# Patient Record
Sex: Female | Born: 2000 | Race: Black or African American | Hispanic: No | Marital: Single
Health system: Southern US, Community
[De-identification: ages and names within clinical notes are randomized; demographics above are authoritative.]

## PROBLEM LIST (undated history)

## (undated) HISTORY — PX: HIP SURGERY: SHX245

---
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
## 2023-02-05 ENCOUNTER — Inpatient Hospital Stay
Admit: 2023-02-05 | Discharge: 2023-02-05 | Disposition: A | Discharge status: Home / Self Care | Source: Home / Self Care

## 2023-02-05 DIAGNOSIS — N939 Abnormal uterine and vaginal bleeding, unspecified: Secondary | ICD-10-CM

## 2023-02-05 DIAGNOSIS — R110 Nausea: Secondary | ICD-10-CM

## 2023-02-05 LAB — CBC: MCH CONCENTRATION: 32.5 g/dL (ref 31.5–35.5)

## 2023-02-05 LAB — eGFR by Creatinine

## 2023-02-05 LAB — Glucose: GLUCOSE: 91 mg/dL (ref 65–99)

## 2023-02-05 LAB — Electrolyte Panel
POTASSIUM: 4.3 mmol/L (ref 3.6–5.3)
SODIUM: 137 mmol/L (ref 135–146)

## 2023-02-05 LAB — hCG,Total beta (ED): HCG,TOTAL BETA (ED): <1 m[IU]/mL

## 2023-02-05 LAB — Lipase: LIPASE: 21 U/L (ref 13–69)

## 2023-02-05 LAB — Hepatic Funct Panel: ALANINE AMINOTRANSFERASE: 18 U/L (ref 8–70)

## 2023-02-05 NOTE — ED Notes
Patient stable for d/c per MD. Aftercare instructions provided for patient. Instructed to f/u with pcp, strict return precaution, verbalized understanding. Pt alert and oriented. All belongings taken with patient.Ambulatory with strong steady gait.

## 2023-02-05 NOTE — Discharge Instructions
You have been evaluated in the [facility] Emergency Department today for your vaginal bleeding.  I am not sure what was causing her symptoms however you are not pregnant.  You were also having some epigastric pain but your labs are normal and did not see any stones or swelling of the gallbladder on your ultrasound.     Please follow up with your primary care physician within two days. Please follow-up with your primary care doctor. You can find a primary care doctor at [facility] by calling [phone].     Please follow up with your OB/Gyn within 2 days. Call [phone] to schedule an appointment with a [facility].     Return to the Emergency Department if you experience worsening or uncontrolled bleeding, shortness of breath, feeling lightheaded, chest tightness, abdominal cramping, severe abdominal pain, fevers,vomiting, or for any other concerning symptoms.     Thank you for choosing [facility] for your care.

## 2023-02-08 NOTE — ED Provider Notes
Debbie Knapp [facility]  Emergency Department Service Report    Triage     Debbie Knapp, a 22 y.o. female, presents with Vaginal Bleeding (Pt c/o vaginal bleeding ) and Possible Pregnancy (Pt c/o vaginal bleeding & possible pregnancy. Bleeding started around 1300 this after, Hemocue 12.7 in triage. Pt period 28 days late, took 2 pregnancy tests, one positive and one negative. Pt endorses urinary frequency, nausea, food cravings.  )    Arrived on 02/04/2023 at 10:18 PM   Arrived by Walk-in [14]    ED Triage Vitals   Temp Temp Source BP Heart Rate Resp SpO2 O2 Device Pain Score Weight   02/04/23 2302 02/04/23 2302 02/04/23 2302 02/04/23 2302 02/04/23 2302 02/04/23 2302 02/05/23 0340 -- --   36.5 ?C (97.7 ?F) Temporal 125/83 (!) 105 18 100 % None (Room air)         Not on File     Initial Physician Contact       Comprehensive Exam Initiated  Contact Date: 02/05/23  Contact Time: 0213    History   HPI  22 year old female presents with concerns over a missed period, breast tenderness, epigastric pain and vomiting with eating.Patient states that approximately 4 weeks ago was her last.  And around that time she also had a sexual encounter.  Patient denies any dysuria, vaginal pain, vaginal discharge.  Patient is concerned that she is pregnant.  Patient did start having scant vaginal bleeding in the last 2 days.  This is minor without gushing blood or clots.  Patient was denies any presyncopal symptoms such as dizziness, malaise, fatigue.  Patient was does not have concerns for STI.  Patient states she took a home pregnancy test and 1 was fainting in the positive.  Patient denies any diarrhea, fevers, dysuria, flank pain, bloody stools or bloody vomit.  Patient has never been diagnosed with gastritis, GERD, gallstones.      Financial risk analyst Used?: No               History reviewed. No pertinent past medical history.     History reviewed. No pertinent surgical history.     Past Family History family history is not on file.     Past Social History   she has no history on file for tobacco use, alcohol use, drug use, and sexual activity.       Physical Exam   Physical Exam  GENERAL APPEARANCE:  AxOx4, generally well-appearing, no acute distress.  HEENT:  NC, AT. MMM. EOMI, clear conjunctiva, oropharynx clear.  NECK:  Supple without lymphadenopathy.  No stiffness or restricted ROM.  HEART:  Normal rate and regular rhythm, normal S1/S1, no m/r/g  LUNGS:  CTAB, moving air well. No crackles or wheezes are heard.  ABDOMEN:  Soft, nontender, nondistended with good bowel sounds heard.  BACK: No CVAT, no obvious deformity.  EXTREMITIES:  Without cyanosis, clubbing or edema.  NEUROLOGICAL:  Grossly nonfocal. Alert and oriented, moving all 4 extremities. CN not formally tested but appear grossly intact. Observed to ambulate with normal gait.  Skin:  Warm and dry without any rash.      Medical Decision Making   Debbie Knapp is a 22 y.o. female     Patient presents requesting a pregnancy test, also reporting epigastric abdominal pain and vomiting after eating.  Patient denies red flag symptoms to include vaginal discharge, dysuria, vaginal or pelvic pain.  On exam patient has mild epigastric tenderness without any lower  abdominal/pelvic tenderness.  Patient was well-appearing.  Serum HCG was negative.  Etiologies of the patient's epigastric pain include gastritis, GERD, gallstone, peptic ulcer disease.  Bedside ultrasound shows no gallstones, gallbladder dilation, gallbladder wall thickening.  Labs are unremarkable to include CBC, bilirubin, lipase, alk phos.  I offered the patient a pelvic exam and STI testing and she deferred as she has not concerned about STI.  Also offered ultrasound with the patient would also like to defer as she was reassured that she has not pregnant (the questionable sexual encounter occurred approximately 4 weeks ago).  The patient was advised to start over-the-counter as a antacid such as Pepcid and she verbalized understanding of this.  Return precautions were discussed patient was discharged in good condition.    Medical Decision Making  Amount and/or Complexity of Data Reviewed  Labs: ordered. Decision-making details documented in ED Course.  Radiology: ordered.             Progress Notes / Reassessments     ED Course as of 02/08/23 1633   Tue Feb 05, 2023   0218 hCG,Total beta: <1 [RK]   0224 White Blood Cell Count(!): 11.13 [RK]   0259 Albumin: 3.9 [RK]   0259 Bilirubin,Total: 0.2 [RK]   0259 Bilirubin,Conj: <0.2 [RK]   0259 Alkaline Phosphatase: 90 [RK]   0259 AST (SGOT): 29 [RK]   0259 ALT (SGPT): 18 [RK]   0259 Lipase: 21 [RK]      ED Course User Index  [RK] Debbie Argyle, Richard A. III, MD              ED Course      Laboratory Results     Labs Reviewed   CBC - Abnormal; Notable for the following components:       Result Value    White Blood Cell Count 11.13 (*)     All other components within normal limits   ELECTROLYTE PANEL - Normal   GLUCOSE - Normal   HEPATIC FUNCT PANEL - Normal   LIPASE - Normal   HCG,TOTAL BETA,STAT - ED   EGFR BY CREATININE   RH TYPE       Imaging Results     No orders to display       Consults     Consult Orders Placed This Encounter       None            Clnical Impression        1. Vaginal bleeding    2. Nausea without vomiting           Disposition and Follow-up   Disposition: Discharge [1]       Follow up with: PMD and outpatient Gyn    Return precautions are specified on After Visit Summary.    There are no discharge medications for this patient.      Medications Administered This Encounter       None              Resident Signature          Knecht, Richard A. III, MD  Resident  02/08/23 1633    ATTENDING NOTE    I was present with the resident during the key/critical portions of this service. I have discussed the management with the resident, have reviewed the resident note and agree with the documented findings and plan of care.                 Debbie Knapp, [place]  P., MD  02/08/23 1954

## 2023-06-11 ENCOUNTER — Inpatient Hospital Stay
Admit: 2023-06-11 | Discharge: 2023-06-11 | Disposition: A | Discharge status: Home / Self Care | Payer: PRIVATE HEALTH INSURANCE | Source: Home / Self Care

## 2023-06-11 DIAGNOSIS — R509 Fever, unspecified: Secondary | ICD-10-CM

## 2023-06-11 DIAGNOSIS — J029 Acute pharyngitis, unspecified: Secondary | ICD-10-CM

## 2023-06-11 LAB — HIV-1/2 Ag/Ab 4th Generation with Reflex Confirmation: HIV-1/2 AG/AB 4TH GENERATION WITH REFLEX CONFIRMATION: NONREACTIVE

## 2023-06-11 LAB — Expedited COVID-19 and Influenza A B PCR: INFLUENZA A PCR: NOT DETECTED

## 2023-06-11 LAB — Group A Streptococcus PCR: GROUP A STREPTOCOCCUS PCR: NOT DETECTED

## 2023-06-11 LAB — Infectious Mono Ab: INFECTIOUS MONONUCLEOSIS AB: NONREACTIVE

## 2023-06-11 MED ADMIN — ACETAMINOPHEN 500 MG PO TABS: 1000 mg | ORAL | @ 16:00:00 | Stop: 2023-06-11 | NDC 00904673061

## 2023-06-11 MED ADMIN — ONDANSETRON HCL 4 MG/2ML IJ SOLN: 4 mg | INTRAVENOUS | @ 17:00:00 | Stop: 2023-06-11 | NDC 60505613000

## 2023-06-11 MED ADMIN — LACTATED RINGERS IV BOLUS: 1000 mL | INTRAVENOUS | @ 17:00:00 | Stop: 2023-06-11 | NDC 00338011704

## 2023-06-11 MED ADMIN — KETOROLAC TROMETHAMINE 30 MG/ML IJ SOLN: 15 mg | INTRAVENOUS | @ 17:00:00 | Stop: 2023-06-11 | NDC 25021070101

## 2023-06-11 MED ADMIN — DEXAMETHASONE SODIUM PHOSPHATE 4 MG/ML IJ SOLN (IV FOR PO): 10 mg | ORAL | @ 19:00:00 | Stop: 2023-06-11 | NDC 67457042300

## 2023-06-11 NOTE — Discharge Instructions
Emergency Department Discharge Instructions      Summary of your visit  You have been evaluated in the [facility] Emergency Department today for your sore throat.  Your evaluation included a physical exam and labs.  We have observed you in the ER and have determined that you are stable for discharge at this time.  Your swabs for mono, strep, COVID, and flu have been negative. Your symptoms are likely due to a viral illness. Continue to tylenol and ibuprofen for your symptoms. You may take tylenol 650 mg every 6 hours and ibuprofen 400 mg every 6 hours, and alternate these every 3 hours as needed for fever or pain.     Follow-Up  Please follow up with your primary care physician within three days after being discharged from the ER - you can call to schedule an appointment.  You can find a primary care physician at [facility] by calling [phone].    If you do not have a Primary Care Physician, please call your insurance company or you may call [phone] to establish care with a [facility] physician.  If you are uninsured, please call 2-1-1 to find a free or low-cost clinic in your area. 2-1-1 [place] is the central source for providing information and referrals for all health and human services in [facility] [place]. Our 2-1-1 phone line is open 24 hours, 7 days a week, with trained [employer] prepared to offer help with any situation, any time. Our community services go far beyond phone referrals - explore our website to learn more. If you are calling from outside [facility] or cannot directly dial 2-1-1, you can call [phone].      Return to the Emergency Department if you experience:  Fevers 100.4 ?F or greater  Worsening or uncontrolled pain  Tongue swelling  Trouble swallowing  Trouble breathing  A change in your voice  Persistent nausea and vomiting  Chest pain or shortness of breath  Any other concerning symptoms     Thank you for choosing [facility] for your care. It was a pleasure taking part in your care today, and we wish you the best!
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
## 2023-06-12 DIAGNOSIS — J029 Acute pharyngitis, unspecified: Secondary | ICD-10-CM

## 2023-06-12 DIAGNOSIS — R509 Fever, unspecified: Secondary | ICD-10-CM

## 2023-06-12 LAB — RPR: RPR: NONREACTIVE

## 2023-06-13 ENCOUNTER — Inpatient Hospital Stay
Admit: 2023-06-13 | Discharge: 2023-06-13 | Disposition: A | Discharge status: Home / Self Care | Payer: PRIVATE HEALTH INSURANCE | Source: Home / Self Care | Attending: Student in an Organized Health Care Education/Training Program

## 2023-06-13 LAB — Extra Dark Green Top

## 2023-06-13 LAB — Extra Gold Top

## 2023-06-13 LAB — Group A Streptococcus PCR: GROUP A STREPTOCOCCUS PCR: NOT DETECTED

## 2023-06-13 LAB — Extra Light Green Top

## 2023-06-13 LAB — Extra Lavender Top

## 2023-06-13 MED ADMIN — LIDOCAINE VISCOUS HCL 2 % MT SOLN: 20 mL | ORAL | @ 07:00:00 | Stop: 2023-06-13 | NDC 00121495015

## 2023-06-13 MED ADMIN — ACETAMINOPHEN 10 MG/ML IV SOLN: 1000 mg | INTRAVENOUS | @ 05:00:00 | Stop: 2023-06-13 | NDC 00264410090

## 2023-06-13 MED ADMIN — KETOROLAC TROMETHAMINE 30 MG/ML IJ SOLN: 15 mg | INTRAVENOUS | @ 05:00:00 | Stop: 2023-06-13 | NDC 25021070101

## 2023-06-13 MED ADMIN — LACTATED RINGERS IV BOLUS: 500 mL | INTRAVENOUS | @ 07:00:00 | Stop: 2023-06-13 | NDC 00338011704

## 2023-06-13 MED ADMIN — SODIUM CHLORIDE 0.9 % IV BOLUS: 1000 mL | INTRAVENOUS | @ 05:00:00 | Stop: 2023-06-13 | NDC 00338004904

## 2023-06-13 NOTE — Discharge Instructions
Emergency Department Discharge Instructions      Summary of your visit  You have been evaluated in the [facility] Department today for your sore throat.  Your evaluation included a physical exam.  We have observed you in the ER and have determined that you are stable for discharge at this time.      Follow-Up  Please follow up with your primary care physician within three days after being discharged from the ER - you can call to schedule an appointment.  You can find a primary care physician at [facility] by calling [phone].    If you do not have a Primary Care Physician, please call your insurance company or you may call [phone] to establish care with a [facility] physician.  If you are uninsured, please call 2-1-1 to find a free or low-cost clinic in your area. 2-1-1 [place] is the central source for providing information and referrals for all health and human services in [place]. Our 2-1-1 phone line is open 24 hours, 7 days a week, with trained Community Resource Advisors prepared to offer help with any situation, any time. Our community services go far beyond phone referrals - explore our website to learn more. If you are calling from outside [place] or cannot directly dial 2-1-1, you can call [phone].      Return to the Emergency Department if you experience:  Fevers 100.4 F or greater  Worsening or uncontrolled pain  Tongue swelling  Trouble swallowing  Trouble breathing  A change in your voice  Persistent nausea and vomiting  Chest pain or shortness of breath  Any other concerning symptoms     Thank you for choosing [facility] for your care. It was a pleasure taking part in your care today, and we wish you the best!

## 2023-06-13 NOTE — ED Notes
[facility]?NOTIFICATION?06/12/2023 21:15?VANITY, DAIGLER S?MRN: [id number]    Criteria Met      2 Visits in 30 Days    Security and Safety  No Security Events were found.  ED Care Guidelines  There are currently no ED Care Guidelines for this patient. Please check your facility's medical records system.        Prescription Drug Report (12 Mo.)  PDMP query found no report.    E.D. Visit Count (12 mo.)  Facility Visits   [name]   Total 3   Note: Visits indicate total known visits.     Recent Emergency Department Visit Summary  Date Facility [facility] Type Diagnoses or Chief Complaint    Jun 12, 2023  [facility]  Emergency     Jun 11, 2023  [facility] Dur[facility]  [place]  Emer[place]      1. Fever, unspecified      1. Headache      2. Acute pharyngitis, unspecified      2. ILI/PUI      Feb 05, 2023  Ser[facility] Sl[facility]  [place]  Emergency      1. Abnormal uterine and vaginal bleeding, unspecified      1. Vaginal Bleeding      2. Nausea      2. Possible Pregnancy        Recent Inpatient Visit Summary  No Recent Inpatient Visits were found.  Care Team  [facility] Phone Fax Service Dates   Eliezer Mccoy, M.D. Pediatrics   Current    Adele Schilder, PA Physician Assistant [phone]  Current    PECK, Marylou Flesher, FNP Nurse Practitioner: Family [phone] Current      [facility]  This patient has registered at the [facility] Emergency Department  For more information visit: [email or web address]   PLEASE NOTE:     1.   Any care recommendations and other clinical information are provided as guidelines or for historical purposes only, and providers should exercise their own clinical judgment when providing care.    2.   You may only use this information for purposes of treatment, payment or health care operations activities, and subject to the limitations of applicable [facility] Policies.    3.   You should consult directly with the organization that provided a care guideline or other clinical history with any questions about additional information or accuracy or completeness of information provided.    ? 2024 [facility] - [email or web address]

## 2023-06-13 NOTE — ED Notes
Pt refused IV access from this time. Pt anxious and requests if we can start access once she is ''roomed in a comfortable area.''

## 2023-06-13 NOTE — ED Provider Notes
Debbie Knapp [facility]  Emergency Department Service Report    Triage     Debbie Knapp, a 22 y.o. female, presents with Sore Throat (Per patient was seen here yesterday for sore throat, fever, and headache. Strep was done but negative. Today she states that the pain has worsened. Unable to eat. Patient states she had a fever all day but has not taken any medication due to the pain ) and Fever    Arrived on 06/12/2023 at 9:15 PM   Arrived by Walk-in [14]    ED Triage Vitals   Temp Temp Source BP Heart Rate Resp SpO2 O2 Device Pain Score Weight   06/12/23 2129 06/12/23 2128 06/12/23 2128 06/12/23 2128 06/12/23 2129 06/12/23 2128 -- 06/12/23 2129 06/12/23 2129   (!) 39.6 ?C (103.2 ?F) Oral 114/73 (!) 141 18 99 %  Ten 95.3 kg (210 lb)       No Known Allergies     Initial Physician Contact       Comprehensive Exam Initiated  Contact Date: 06/12/23  Contact Time: 2228    History   HPI    Patient is a previously healthy 22 year old woman who presents with fever and sore throat.  Patient was here in the emergency department yesterday for the similar complaint.  Workup there was unremarkable with negative strep throat negative mono negative flu negative COVID.  Patient was given steroids and analgesia and fluids.  After which she found improvement and was discharged yesterday.  He says she was feeling okay when she went home but woke up this morning with continued persistently sore throat and difficulty swallowing.  She has had poor p.o. intake but has been able to drink water. No drooling, voice changes, or trismus. Tolerating secretions. Can take PO. No facial swelling.  Patient has not been recently sexually active since she has moved to [place] for the past 5 months.  Does not want to be tested for STDs.    Patient has been having some productive cough as well as URI symptoms.  No sick contacts.  Language Assistance                     History reviewed. No pertinent past medical history.     Past Surgical History:   Procedure Laterality Date    HIP SURGERY          Past Family History   family history is not on file.     Past Social History   she reports that she has never smoked. She has never used smokeless tobacco. She reports that she does not currently use alcohol. She reports current drug use. Drug: Marijuana. No history on file for sexual activity.       Physical Exam   Physical Exam  Gen: Well-appearing adult  HEENT: No scleral icterus, conjunctiva normal. Tonsillar stones, Uvula midline, Soft palate without bulge. FOM soft. No trismus. No TTP on teeth. No obvious caries.  Neck: Trachea midline.  Full neck range of motion.  No stiffness.  CV: RRR. Symmetric radial pulses. Good cap refill. No LE Edema.  Resp: Unlabored breathing.   Abdomen: Soft.  Extremities: Warm and well-perfused.  Skin: No Visible skin lesions or rashes.   Neuro: Alert and oriented. Attentive to conversation. Fluent speech. Eyes track spontaneously. Face symmetric. No dysarthria. Hearing intact to conversation. Moves all fours AG.  Psych: Cooperative behavior. Linear thought process.      Medical Decision Making   Patient  is a previously healthy 22 year old woman who presents with fever and sore throat.  Patient febrile and tachycardic here, likely in the setting of poor p.o. intake.  We will give analgesia and IV fluids.  We will defer steroids here since patient was given dexamethasone yesterday.  Patient otherwise well appearing.  Exam and presentation most consistent with a viral syndrome.  Low concern for bacterial pharyngitis at this time.  No evidence of PTA and lower concern for RPA or other deep neck space infection at this time.  Patient otherwise well-appearing.  We will provide supportive care with plans to DC    Medical Decision Making     Problems  Clinical Impressions Complexity of problems addressed         Sore throat (Primary)  Fever, unspecified fever cause High:  []  Acute/chronic illness/injury with threat to life to bodily function  []  Chronic illness with severe exacerbation, progression, or side effects of treatment    Moderate:  [x]  Undiagnosed new problem with uncertain prognosis  []  Acute illness with systemic symptoms  []  Acute complicated injury    Data and Risk  Independent Historian []  Parent as child too young to provide hx []  Family/Caregiver due to AMS/dementia []  EMS due to medical acuity/trauma []  Family/EMS due to behavioral health concern and for collateral []    External Data Reviewed previous workup and mgmt of patient's Sore Throat and Fever via  []  Previous [place] Notes/Labs/Imaging []  External Notes/Labs/Imaging  which were non-contributory unless documented otherwise in HPI and ED Course   Considered but decided against  []  CT Head/C-spine given [place] CT/NEXUS/PECARN criteria []  CTA Chest to r/o PE given PERC/Well's criteria []  CT AP to r/o appendicitis given PAS score or family discussion []  Hospitalization due to *  []    Discussed w/ ext HCP []  Consults, PCP/outpt specialists, nursing home. See ED Course for details.   SDOH Affecting Dx/Tx []  Insurance limiting specialist referral []  Housing instability limiting outpt mgmt   []  Financial insecurity limiting medication access []  Substance/ETOH use []    Care de-escalation []  Shared decision-making regarding de-escalation of care (e.x. DNR) or foregoing hospitalization-level of care due to patient wishes/goals of care   Interpretations See ED Course. []     If applicable, parenteral controlled substances, drug therapies requiring intensive monitoring for toxicity, and prescription drug management are documented in the the Medications section of this note. If applicable, major and minor procedures are documented separately in Procedure Notes.         Progress Notes / Reassessments                 ED Course      Laboratory Results     Labs Reviewed   GROUP A STREPTOCOCCUS PCR - Normal   BACTERIAL CULTURE THROAT   EXTRA DARK GREEN TOP   EXTRA LIGHT GREEN TOP EXTRA LAVENDER TOP   RAINBOW DRAW TO LABORATORY    Narrative:     The following orders were created for panel order Brigid Re (ED Adult Blood Draw: Nurse Protocol).  Procedure                               Abnormality         Status                     ---------                               -----------         ------  Extra Anthoney Harada FAO[[id number]]                             Final result               Extra Burna Mortimer [phone]                                                       Extra Burna Mortimer [phone]                            Final result               Extra Lavender [phone]                               Final result               Extra Lavender Top[[id number]]                                                          Extra Gold Top[[id number]]                                                                Please view results for these tests on the individual orders.   EXTRA LIGHT GREEN TOP   EXTRA LAVENDER TOP   EXTRA GOLD TOP       Imaging Results     No orders to display       Consults     Consult Orders Placed This Encounter       None            Clinical Impressions           Sore throat (Primary)  Fever, unspecified fever cause       Disposition and Follow-up     Disposition: Discharge [1]    No future appointments.    Follow up with:  No follow-up provider specified.    Return precautions are specified on After Visit Summary.    There are no discharge medications for this patient.      Medications Administered This Encounter           Status .     lactated ringers IV soln bolus 500 mL  Once         Last MAR action: Stopped      lidocaine viscous 2% oral soln 20 mL  STAT         Last MAR action: Given      acetaminophen IV inj 1,000 mg  Once         Last MAR action: Given      sodium chloride 0.9% IV soln bolus 1,000 mL  Once         Last [facility]  action: Stopped      ketorolac 30 mg/mL inj 15 mg  STAT         Last MAR action: Given Resident Signature          Jolly Mango, MD  Resident  06/12/23 2348    I was present with the resident during the key/critical portions of this service.  I have discussed the management with the resident, have reviewed the resident note and agree with the documented findings and plan of care.        Ernie Avena., MD, MBA  06/13/23 [phone]

## 2023-06-13 NOTE — ED Notes
Pt d/c'd per order. Discharge education and medication education provided w/ teach back returned. VSS. No signs of acute distress. Pt out of ED w/ steady gait.

## 2023-06-14 LAB — Bacterial Culture Throat: BACTERIAL CULTURE THROAT: NORMAL

## 2023-06-15 LAB — Bacterial Culture Throat: BACTERIAL CULTURE THROAT: NORMAL

## 2023-09-04 ENCOUNTER — Ambulatory Visit: Payer: PRIVATE HEALTH INSURANCE

## 2023-09-06 ENCOUNTER — Ambulatory Visit: Payer: PRIVATE HEALTH INSURANCE

## 2023-09-06 DIAGNOSIS — F419 Anxiety disorder, unspecified: Secondary | ICD-10-CM

## 2023-09-06 DIAGNOSIS — Z23 Encounter for immunization: Secondary | ICD-10-CM

## 2023-09-06 DIAGNOSIS — F649 Gender identity disorder, unspecified: Secondary | ICD-10-CM
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
## 2023-09-06 NOTE — Progress Notes
History was provided by the patient.    Debbie Knapp is a 22 y.o. NB who is here for an annual physical.      CURRENT ISSUES:  Current concerns include    #gender dysphoria  -- identifies as ''Debbie Knapp''  -- pronouns in chart are she/her/hers; Debbie Knapp does not want to change it at this time, but we discussed that they may change their pronouns at any time to best represent who they are  -- Debbie Knapp states that they have never felt that their gender identity fit with a gender binary. They moved recently from [place] and have been able to begin to further explore their gender identity and that they are now ready to start their journey to having their gender expression match their gender identity  -- Debbie Knapp would like to start testosterone, have top surgery, and is exploring bottom surgery  -- Debbie Knapp states that their menstrual cycles are very distressing and that they have been on birth control (nexplanon) previously to help stop it, but are not on any now    #mood  -- endorses significant anxiety  -- has been on several psychiatric medications in the past, but is unsure which ones  -- saw psychology today; please see psychology note from today for further details    #weight  -- have been trying to eat healthier / exercise  -- interested in possibly starting a GLP-1 agonist      MEDICATIONS/ALLERGIES:  No outpatient medications prior to visit.     No facility-administered medications prior to visit.     Allergies   Allergen Reactions    Pear Unknown (Patient not available)    Pineapple Other (See Comments)     Blisters on tongue     PAST MEDICAL HISTORY:  No past medical history on file.      FAMILY HISTORY:  No family history on file.    -Alcohol/drug use: denies    -What was your sex assigned at birth? female  -What is your sexual identity? queer  -Have you ever had sex (oral, vaginal, or anal)? yes      Gyn History:  Menstrual periods: reg, every 4 weeks, duration 5 days, +dysmenorrhea  History of STD? no  Last STI testing: has been tested since last partner  Last pap testing: n/a  Past history of genital infections: none    Contraception history:  Nexplanon in pas    ROS:  Pertinent positives noted in HPI  The remainder of systems were reviewed and are negative.     PHYSICAL EXAMINATION:    Chaperone:  No sensitive exam    Objective:        Vitals:    09/06/23 1441   BP: 102/71   Pulse: (!) 102   Temp: 36.3 ?C   TempSrc: Tympanic   Weight: (!) 101.3 kg (223 lb 6.4 oz)   Height: 1.587 m (5' 2.48'')     Body mass index is 40.23 kg/m?Marland Kitchen  Blood pressure %iles are not available for patients who are 18 years or older.  General appearance: alert, appears stated age, and cooperative  Head: Normocephalic, without obvious abnormality, atraumatic  Ears: normal TM's and external ear canals both ears  Nose: Nares normal. Septum midline. Mucosa normal. No drainage or sinus tenderness.  Throat: lips, mucosa, and tongue normal; teeth and gums normal  Neck: no adenopathy, no carotid bruit, no JVD, supple, symmetrical, trachea midline, and thyroid not enlarged, symmetric, no tenderness/mass/nodules  Lungs: clear to auscultation  bilaterally  Heart: regular rate and rhythm, S1, S2 normal, no murmur, click, rub or gallop  Abdomen: soft, non-tender; bowel sounds normal; no masses,  no organomegaly  Extremities: extremities normal, atraumatic, no cyanosis or edema  Neurologic: Grossly normal  normal mood, behavior, speech, dress, and thought processes             ASSESSMENT/PLAN  22 y.o. old female here for annual physical with following problems and associated plans:    There are no diagnoses linked to this encounter.  Immunizations today: per orders. I discussed the risks and benefits of vaccines given today with parent/guardian. VIS sheets were given to the parent/guardian Debbie Knapp is able to receive appropriate vaccination today. she: does not have any significant illness, is not allergic to any of the vaccine components, has not had a serious vaccine reaction in the past, has not received blood products or immune globulin in the last 4 months, is not taking oral steroids or other immunosuppressive medications, has not received any vaccinations in the last 28 days, and is not pregnant.     1. Anticipatory guidance discussed.  -- vaccines updated    2. Gender Dysphoria  -- will place baseline laboratory studies  -- will speak with gender affirming care attending, Dr. Lupita Dawn, and create a plan of care  -- will start aygestin for menstrual suppression  -- patient to follow up in 2 weeks    3. GAD  -- will start lexapro 10mg  today  -- mental health resources given to Debbie Knapp by psychology  -- will follow up in 2 weeks with psychology    4. Elevated BMI  -- will discuss GLP-1 agonist at next visit  -- discussed dietary / exercise habits    Follow-up visit in 2 to 3 weeks    Author: Carloyn Manner 09/12/2023 2:25 PM   Adolescent Medicine PGY5    Discussed with attending Dr. Dareen Piano.  I saw and evaluated Debbie Knapp.  I discussed the case with the fellow and agree with the findings and plan of care as documented in the fellow's note along with my additions and/or corrections.     Sheron Nightingale. Dareen Piano

## 2023-09-07 MED ORDER — ESCITALOPRAM OXALATE 10 MG PO TABS
10 mg | ORAL_TABLET | Freq: Every day | ORAL | 3 refills | Status: AC
Start: 2023-09-07 — End: ?

## 2023-09-07 MED ORDER — MEDROXYPROGESTERONE ACETATE 5 MG PO TABS
5 mg | ORAL_TABLET | Freq: Every day | ORAL | 3 refills | 30.00000 days | Status: AC
Start: 2023-09-07 — End: ?

## 2023-09-07 MED ORDER — ESCITALOPRAM OXALATE 10 MG PO TABS
10 mg | ORAL_TABLET | Freq: Every day | ORAL | 0 refills | 30.00000 days | Status: AC
Start: 2023-09-07 — End: 2023-09-07

## 2023-09-07 MED FILL — ESCITALOPRAM OXALATE 10 MG PO TABS: 10 mg | ORAL | 30 days supply | Qty: 30 | Fill #0

## 2023-09-12 ENCOUNTER — Ambulatory Visit: Payer: PRIVATE HEALTH INSURANCE

## 2023-09-12 DIAGNOSIS — Z0000 Encounter for general adult medical examination without abnormal findings: Secondary | ICD-10-CM

## 2023-09-12 DIAGNOSIS — Z6841 Body Mass Index (BMI) 40.0 and over, adult: Secondary | ICD-10-CM

## 2023-09-27 ENCOUNTER — Ambulatory Visit: Payer: PRIVATE HEALTH INSURANCE

## 2023-09-27 DIAGNOSIS — F649 Gender identity disorder, unspecified: Secondary | ICD-10-CM

## 2023-09-27 DIAGNOSIS — F419 Anxiety disorder, unspecified: Secondary | ICD-10-CM

## 2023-09-27 MED ORDER — SEMAGLUTIDE-WEIGHT MANAGEMENT 0.25 MG/0.5ML SC SOAJ
.25 mg | SUBCUTANEOUS | 0 refills | 28.00 days | Status: AC
Start: 2023-09-27 — End: ?
  Filled 2023-11-20 (×2): qty 2, 28d supply, fill #0

## 2023-09-27 NOTE — Patient Instructions
Semaglutide instructions  Semaglutide makes you feel more full and slows down your gastrointestinal motility. The most common side effects are nausea, abdominal discomfort, constipation, and diarrhea. These are worst when starting the medication or increasing the dose, and get better as you get used to the medication. Eating smaller portions and staying well hydrated can help to prevent side effects. Avoid excessive alcohol intake to reduce the risk of dehydration and pancreatitis. Do NOT take the medication if you have a history of MEN2 syndrome or medullary thyroid cancer in your family.    This medication alone does not typically cause low blood sugar, but can when used in combination with insulin or other diabetes medications. If you experience symptoms of a low blood sugar such as lightheadedness, shakiness, dizziness, extreme hunger, palpitations, or anxiety make sure to check a blood sugar. If it is 70mg /dL, make sure you treat the low blood sugar by taking 4oz of juice or candy, rechecking your blood sugar and repeating until it is >70mg /dL. Afterwards, contact your doctor for further medication instructions.      GENDER HEALTH TOP SURGERY    For gender-affirming chest or breast surgery, we recommend the following surgeons:     Dr Greggory Stallion Rudkin    Dr Alexis Goodell     Dr Berneice Gandy    Dr Phineas Douglas    For plastic surgery scheduling: all surgeons can be contacted through plastic surgery main scheduling: [phone]  If any difficulty in scheduling the appointment, please contact the clinic manager Christa See at [phone]   Here are some names of top surgeons who take Medi-Cal. Please contact their offices directly:    For voice therapy, please contact:    [facility]  [address]  [place], [place] [address]  [phone]    Please schedule with one of the following vocal therapists:  Jabier Gauss  [facility]        While you are waiting for your first appointment, you can check out these resources online:  TransVoiceLessons:  [email or web address]     Adrian Prince youtube channel and reddit page:   [email or web address]  [email or web address]     Here are some recommended vocal therapists in the community and online:     Chinita Greenland ([facility])  [email or web address]    [facility]  [email or web address]    [facility]  [email or web address]    Trans Voice Lessons, collective of providers   https://www.[email or web address].com/[email or web address]     [place] Jakomin (he/him), queer speech-language pathologist   Runs an online practice focused on providing gender-affirming voice and communication coaching to youth/adults.   Email: [email or web address] Website: [email or web address]   Instagram: [email or web address]        And some other online resources to get you started:  TransVoiceLessons:  [email or web address]     Adrian Prince youtube channel and reddit page:   NamePrediction.[place]  [email or web address]    Micah: gender affirming Scientist, research (life sciences) on tiktok, short and free videos with a Q&A format [email or web address]://www.[email or web address]       BINDER INFORMATION  Try to limit your binding to 8-10 hours per day.   Keep an eye on your skin to be sure there is no redness or irritation.  If you are having trouble exercising, or feel your breathing is restricted, try:  - Binding tape: transtape.life  - gc2b binder: www.gc2b.co    Please see this guide for more information:  MovingJokes.tn.pdf

## 2023-09-27 NOTE — Progress Notes
PATIENT: Debbie Knapp  MRN: [id number]  DOB: July 27, 2001  DATE OF SERVICE: 09/27/2023    Subjective:       History was provided by the patient   Debbie Knapp is a 22 y.o. 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    98 %ile (Z= 2.16) based on CDC (Girls, 2-20 Years) BMI-for-age based on BMI available on 09/27/2023.  99 %ile (Z= 2.21) based on CDC (Girls, 2-20 Years) weight-for-age data using data from 09/27/2023.  24 %ile (Z= -0.71) based on CDC (Girls, 2-20 Years) Stature-for-age data based on Stature recorded on 09/27/2023.     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    Return precautions given.    Follow Up Visit: 4 weeks     Author: Carloyn Manner 09/27/2023  Adolescent Medicine PGY5    Discussed with attending Dr. Dareen Piano. Performed    Extra Burna Mortimer Top    Collection Time: 06/12/23 10:07 PM   Result Value Ref Range    Extra Tube Performed    Extra Lavender Top    Collection Time: 06/12/23 10:07 PM   Result Value Ref Range    Extra Tube Performed    Extra Lavender Top    Collection Time: 06/12/23 10:07 PM   Result Value Ref Range    Extra Tube Performed    Extra Gold Top    Collection Time: 06/12/23 10:07 PM   Result Value Ref Range    Extra Tube Performed    Bacterial Culture Throat  Collection Time: 06/12/23 10:09 PM    Specimen: Throat; Respiratory, Upper   Result Value Ref Range    Bacterial Culture Throat       No pathogenic Beta Streptococci isolated. Normal respiratory tract flora.   Lipid Panel    Collection Time: 09/13/23  1:26 PM   Result Value Ref Range    Cholesterol 156 See Comment mg/dL    Cholesterol,LDL,Calc 104 (H) <100 mg/dL    Cholesterol, HDL 42 (L) >50 mg/dL    Triglycerides 52 <308 mg/dL    Non-HDL,Chol,Calc 657 <130 mg/dL   Comprehensive Metabolic Panel    Collection Time: 09/13/23  1:26 PM   Result Value Ref Range    Sodium 139 135 - 146 mmol/L    Potassium 3.8 3.6 - 5.3 mmol/L    Chloride 103 96 - 106 mmol/L    Total CO2 23 20 - 30 mmol/L    Anion Gap 13 8 - 19 mmol/L    Glucose 102 (H) 65 - 99 mg/dL    Creatinine 8.46 9.62 - 1.30 mg/dL    Estimated GFR >95 See GFR Additional Information mL/min/1.73m2    GFR Additional Information See Comment     Urea Nitrogen 14 7 - 22 mg/dL    Calcium 9.3 8.6 - 28.4 mg/dL    Total Protein 7.7 6.1 - 8.2 g/dL    Albumin 4.1 3.9 - 5.0 g/dL    Bilirubin,Total 0.4 0.1 - 1.2 mg/dL    Alkaline Phosphatase 70 37 - 113 U/L    Aspartate Aminotransferase 25 13 - 62 U/L    Alanine Aminotransferase 16 8 - 70 U/L   Testosterone    Collection Time: 09/13/23  1:26 PM   Result Value Ref Range    Testosterone 28 10 - 83 ng/dL   Hepatitis B Surface Antigen    Collection Time: 09/13/23  1:26 PM   Result Value Ref Range    HBs Ag Nonreactive Nonreactive   HCV Antibody Screen    Collection Time: 09/13/23  1:26 PM   Result Value Ref Range    HCV Ab Screen Nonreactive Nonreactive   CBC    Collection Time: 09/13/23  1:26 PM   Result Value Ref Range    White Blood Cell Count 11.29 (H) 4.16 - 9.95 x10E3/uL    Red Blood Cell Count 4.12 3.96 - 5.09 x10E6/uL    Hemoglobin 11.8 11.6 - 15.2 g/dL    Hematocrit 13.2 44.0 - 45.2 %    Mean Corpuscular Volume 86.2 79.3 - 98.6 fL    Mean Corpuscular Hemoglobin 28.6 26.4 - 33.4 pg    MCH Concentration 33.2 31.5 - 35.5 g/dL    Red Cell Distribution Width-SD 47.6 36.9 - 48.3 fL    Red Cell Distribution Width-CV 14.9 11.1 - 15.5 %    Platelet Count, Auto 298 143 - 398 x10E3/uL    Mean Platelet Volume 10.1 9.3 - 13.0 fL    Nucleated RBC%, automated 0.0 No Ref. Range %    Absolute Nucleated RBC Count 0.00 0.00 - 0.00 x10E3/uL    Neutrophil Abs (Prelim) 7.26 See Absolute Neut Ct. x10E3/uL   Differential, Automated    Collection Time: 09/13/23  1:26 PM   Result Value Ref Range    Neutrophil Percent, Auto 64.4 No Ref. Range %    Lymphocyte Percent, Auto 30.2 No Ref. Range %    Monocyte Percent, Auto 3.5 No Ref. Range %    Eosinophil Percent, Auto 1.2 No Ref. Range %  Basophil Percent, Auto 0.3 No Ref. Range %    Immature Granulocytes% 0.4 No Reference Range %    Absolute Neut Count 7.26 (H) 1.80 - 6.90 x10E3/uL    Absolute Lymphocyte Count 3.41 (H) 1.30 - 3.40 x10E3/uL    Absolute Mono Count 0.40 0.20 - 0.80 x10E3/uL    Absolute Eos Count 0.14 0.00 - 0.50 x10E3/uL    Absolute Baso Count 0.03 0.00 - 0.10 x10E3/uL    Absolute Immature Gran Count 0.05 (H) 0.00 - 0.04 x10E3/uL       Studies  ***    ASSESSMENT/PLAN  Sander Radon is a 22 y.o. patient, who desires treatment with masculinizing hormone therapy for the purpose of gender affirmation, to better align phenotype with gender identity.     Patient has reviewed the patient information provided to them regarding the risks, benefits and expected effects of testosterone therapy.   {Blank single:19197::''He'',''She'',''They'',''***''} {has have:305034} asked appropriate questions, including ***.   {Blank single:19197::''He'',''She'',''They'',''***''} {has have:305034} expressed understanding that testosterone is expected to have irreversible effects, including clitoral enlargement, voice deepening, scalp and body hair changes, and unknown but possible effects on oocytes.  {Blank single:19197::''He'',''She'',''They'',''***''} {Actions; is/are/not:32546} eager to begin.   {Blank single:19197::''He'',''She'',''They'',''***''} {has have:305034} been provided with information regarding the potential impact of treatment on fertility, and {would/not:18918} like to have further counseling on options at this time.   Baseline labwork was: {Orders:20858::''Not indicated''}  {Blank single:19197::''He'',''She'',''They'',''***''} {has have:305034} been provided with SQ injection instruction review today in our office, and {has have:305034} been given written instructions for such.  {Blank single:19197::''He'',''She'',''They'',''***''} will continue to see me or my associates at regular intervals as recommended.     No orders of the defined types were placed in this encounter.         Please note there may be discrepancies in normal laboratory values, medication advisories, preventive health advisories and billing issues for this patient.     The above plan of care, diagnosis, orders, and follow-up were discussed with the patient.  Questions related to this recommended plan of care were answered.    Carloyn Manner, MD  09/27/2023 at 1:39 PM

## 2023-09-28 MED FILL — WEGOVY 0.25 MG/0.5ML SC SOAJ: 0.25 mg/0.5 mL | SUBCUTANEOUS | 28 days supply | Qty: 2 | Fill #0

## 2023-10-11 ENCOUNTER — Ambulatory Visit: Payer: PRIVATE HEALTH INSURANCE

## 2023-10-19 ENCOUNTER — Ambulatory Visit: Payer: PRIVATE HEALTH INSURANCE

## 2023-11-01 ENCOUNTER — Telehealth: Payer: PRIVATE HEALTH INSURANCE

## 2023-11-01 ENCOUNTER — Ambulatory Visit: Payer: PRIVATE HEALTH INSURANCE

## 2023-11-01 DIAGNOSIS — J029 Acute pharyngitis, unspecified: Secondary | ICD-10-CM

## 2023-11-01 DIAGNOSIS — R051 Acute cough: Secondary | ICD-10-CM

## 2023-11-02 MED ORDER — PENICILLIN V POTASSIUM 500 MG PO TABS
500 mg | ORAL_TABLET | Freq: Two times a day (BID) | ORAL | 0 refills | 10.00 days | Status: AC
Start: 2023-11-02 — End: ?

## 2023-11-02 MED ORDER — NAPROXEN 500 MG PO TABS
500 mg | ORAL_TABLET | Freq: Two times a day (BID) | ORAL | 0 refills | 7.00 days | Status: AC
Start: 2023-11-02 — End: ?

## 2023-11-02 NOTE — Progress Notes
Video Immediate Care Note      Date of Service: 11/01/2023    Subjective:     HPI: Santasia Schomberg is a 22 y.o. female on a telehealth visit for:    ~1 week  Sore throat, cough, voice hoarse  Had fever at first, but resolved    No other symptoms except for above.    Past Medical History:  She has no past medical history on file.    Past Surgical History:  She has a past surgical history that includes Hip surgery.    Medication and Supplements:  Outpatient Medications Prior to Visit   Medication Sig    escitalopram 10 mg tablet Take 1 tablet (10 mg total) by mouth daily.    medroxyPROGESTERone 5 mg tablet Take 1 tablet (5 mg total) by mouth daily.    semaglutide (WEGOVY) 0.25 mg/0.5 mL SC injection pen Inject 0.5 mLs (0.25 mg total) under the skin once a week.     No facility-administered medications prior to visit.       Allergies:  Allergies   Allergen Reactions    Pear Unknown (Patient not available)    Pineapple Other (See Comments)     Blisters on tongue       Social History:  She reports that she has never smoked. She has never used smokeless tobacco. She reports that she does not currently use alcohol. She reports current drug use. Drug: Marijuana.    Review of Symptoms:  ROS Negative except for the above.    Objective:     Physical Exam  There were no vitals taken for this visit.    General: alert, well appearing, and in no distress  Head: Atraumatic  Ears: hearing grossly normal  Eyes: EOM intact  Skin: normal coloration and turgor, no rashes, no suspicious skin lesions noted.  Neuro: alert, oriented, normal speech, no focal findings  Pulm: No signs of respiratory distress noted, breathing normal rate    Assessment:     1. Sore throat    2. Acute cough      Orders Placed This Encounter    penicillin V potassium 500 mg tablet    naproxen 500 mg tablet   Viral uri on ddx. However. [facility] 3. Pt Desires empiric    ER and return precautions discussed:  If showing any of these signs, seek emergency medical care immediately  - Trouble breathing  - Persistent pain or pressure in the chest  - Pain out of proportion  - New confusion  - Inability to wake or stay awake  - Pale, gray, or blue-colored skin, lips, or nail beds, depending on skin tone  This list is not all possible symptoms. Please contact your doctor for any other symptoms that are severe or concerning to you.    Previous notes/labs from other providers reviewed, if available  Risks and benefits of medications/treatments discussed and acknowledged  Follow up with PCP  Strict return/ED precautions given. Instructed to return to urgent/ED if symptoms persist or worsen.    This visit was conducted remotely. Significant limitations of evaluation/exam/treatment (and that in person provides more adequate evaluation) during remote visits discussed and accepted. Patient desires to proceed with video visit.    Alinda Sierras, MD, MPH

## 2023-11-03 MED FILL — PENICILLIN V POTASSIUM 500 MG PO TABS: 500 mg | ORAL | 10 days supply | Qty: 20 | Fill #0

## 2023-11-03 MED FILL — NAPROXEN 500 MG PO TABS: 500 mg | ORAL | 7 days supply | Qty: 14 | Fill #0

## 2023-11-04 ENCOUNTER — Ambulatory Visit: Payer: PRIVATE HEALTH INSURANCE

## 2023-11-04 ENCOUNTER — Telehealth: Payer: PRIVATE HEALTH INSURANCE

## 2023-11-08 ENCOUNTER — Ambulatory Visit: Payer: PRIVATE HEALTH INSURANCE

## 2023-11-08 DIAGNOSIS — R233 Spontaneous ecchymoses: Secondary | ICD-10-CM

## 2023-11-08 DIAGNOSIS — M2142 Flat foot [pes planus] (acquired), left foot: Secondary | ICD-10-CM

## 2023-11-08 DIAGNOSIS — M2141 Flat foot [pes planus] (acquired), right foot: Secondary | ICD-10-CM

## 2023-11-09 NOTE — Progress Notes
PATIENT: Debbie Knapp  MRN: [id number]  DOB: 04/19/2001  DATE OF SERVICE: 11/08/2023    Subjective:       History was provided by the patient   Peris Shafiq is a 22 y.o. gender diverse patient who presents for several reasons    #weight  -- has not been able to pick up semaglutide and states that they have not been notified by pharmacy regarding if they have it ready or not; there have been issues with back order    #history of hip surger b/l  -- 2/2 trauma at age 89  -- has intermittent pain, but has not seen orthopedic surgery for several years and they are concerned that it might be getting worse    #history of bunion and pes planus  -- endorses pain; had surgical procedure on one foot, but continues to have pain   -- would like podiatry referral    #Gender affirming care  -- is now interested in starting testosterone  -- does not think that speech therapy would be beneficial at this time    Review of Systems:  Pertinent items are noted in HPI       Objective:        Vitals: BP 113/75  ~ Pulse 93  ~ Temp 36.2 ?C (Forehead)  ~ Ht 1.572 m (5' 1.89'')  ~ Wt 99.8 kg (220 lb 0.3 oz)  ~ SpO2 98%  ~ BMI 40.39 kg/m?    Facility age limit for growth %iles is 20 years.  Facility age limit for growth %iles is 20 years.  Facility age limit for growth %iles is 20 years.     General:   alert and no distress   Gait:   normal   Skin:   normal   Lungs:  clear to auscultation bilaterally   Heart:   regular rate and rhythm, S1, S2 normal, no murmur, click, rub or gallop   Neuro:  mental status, speech normal, alert and oriented x3        Assessment/Plan:      #history of bunions and pes planus  -- will refer to podiatry    #history of bilateral hip surgery / fixation after trauma now with some pain  -- will refer to ortho   #Gender Affirming Care  -- has been thinking more about testosterone and now thinks that they want to pursue hormonal transition  -- will need to follow up appointment for further discussion regarding testosterone and things such as fertility and if they want to preserve oocytes / ovarian tissue  -- management in consultation with [facility]    #Obesity  -- has not picked up wegovy  .  Return precautions given.    Follow Up Visit: schedule visit to discuss testosterone and for weight check after starting wegovy     Author: Carloyn Manner 11/08/2023  Adolescent Medicine PGY5    40 minutes were spent personally by me today on this encounter which include today's pre-visit review of the chart, obtaining appropriate history, performing an evaluation, documentation and discussion of management with details supported within the note for today's visit. The time documented was exclusive of any time spent on the separately billed procedure.      Discussed with attending Dr. Dareen Piano.  I saw and evaluated Debbie Knapp.  I discussed the case with the fellow and agree with the findings and plan of care as documented in the fellow's note along with my additions and/or corrections.  Sheron Nightingale. Dareen Piano

## 2023-11-15 ENCOUNTER — Ambulatory Visit: Payer: PRIVATE HEALTH INSURANCE

## 2023-11-15 MED ORDER — MEDROXYPROGESTERONE ACETATE 5 MG PO TABS
5 mg | ORAL_TABLET | Freq: Every day | ORAL | 3 refills
Start: 2023-11-15 — End: ?

## 2023-11-15 MED ORDER — ESCITALOPRAM OXALATE 10 MG PO TABS
10 mg | ORAL_TABLET | Freq: Every day | ORAL | 3 refills
Start: 2023-11-15 — End: ?

## 2023-11-16 MED ORDER — MEDROXYPROGESTERONE ACETATE 5 MG PO TABS
5 mg | ORAL_TABLET | Freq: Every day | ORAL | 3 refills | 30.00 days | Status: AC
Start: 2023-11-16 — End: ?
  Filled 2023-11-20: qty 30, 30d supply, fill #0

## 2023-11-16 MED ORDER — ESCITALOPRAM OXALATE 10 MG PO TABS
10 mg | ORAL_TABLET | Freq: Every day | ORAL | 3 refills | 90.00000 days | Status: AC
Start: 2023-11-16 — End: ?
  Filled 2023-11-20: qty 90, 90d supply, fill #0

## 2023-11-16 MED FILL — WEGOVY 0.25 MG/0.5ML SC SOAJ: 0.25 mg/0.5 mL | SUBCUTANEOUS | 28 days supply | Qty: 2 | Fill #0

## 2023-11-18 DIAGNOSIS — F649 Gender identity disorder, unspecified: Secondary | ICD-10-CM

## 2023-11-18 DIAGNOSIS — Z9889 Other specified postprocedural states: Secondary | ICD-10-CM

## 2023-11-20 NOTE — Addendum Note
Addended by: Jacquelin Hawking on: 11/20/2023 05:37 AM     Modules accepted: Level of Service

## 2023-11-29 ENCOUNTER — Other Ambulatory Visit: Payer: PRIVATE HEALTH INSURANCE

## 2023-12-05 ENCOUNTER — Ambulatory Visit: Payer: PRIVATE HEALTH INSURANCE

## 2023-12-05 DIAGNOSIS — F649 Gender identity disorder, unspecified: Secondary | ICD-10-CM

## 2023-12-05 MED ORDER — TESTOSTERONE CYPIONATE 200 MG/ML IM SOLN
50 mg | SUBCUTANEOUS | 0 refills
Start: 2023-12-05 — End: ?

## 2023-12-05 MED ORDER — BD DISP NEEDLES 25G X 5/8'' MISC
0 refills
Start: 2023-12-05 — End: ?

## 2023-12-05 MED ORDER — SYRINGE (DISPOSABLE) 1 ML MISC
0 refills
Start: 2023-12-05 — End: ?

## 2023-12-05 MED ORDER — NEEDLE (DISP) 18G X 1'' MISC
0 refills
Start: 2023-12-05 — End: ?

## 2023-12-05 NOTE — Progress Notes
[facility]  GENDER HEALTH INTAKE ASSESSMENT    Debbie Knapp is a 23 y.o. patient who presents for consultation regarding gender health.     Debbie Knapp is accompanied today by: Debbie Knapp was referred by: ***    [name]'s current primary care provider is No primary care provider on file..    Patient's goals  [] Establish primary care  [] Initiate hormones for gender transition  [] Manage maintenance of hormones  [] Referral for surgical services  [] Referral for fertility services  [] Referral for other health concerns  [] Behavioral health services  [] Other    Identifiers  Used name: Debbie Knapp   Personal gender pronoun: he/him/his   Gender identity: transgender female  Assigned/documented sex at birth: female     Transition History  Evolution of gender identity: ***  Social transition: { :23653}  Medical (hormone) transition: { :23653}  1. Testosterone  ***    Surgical transition:   Underwent: {Gender Affirming Surgeries:30831}    Planned: {Gender Affirming Surgeries:30831}       Sources of Dysphoria/  Goals of Treatment Comments    [x]  Chest/Breast  Primary one    [x]  Genitals     [x]  Voice  Wants it to be deeper    []  Scalp hair pattern      [x]  Body shape      []  Emotions/Feelings     [x]  Social presentation     [x]  Menses     []  Other (see comments)        Concerns about treatment Comments    []  Health risks     []  Irreversibility     []  Facial/body hair growth     []  Scalp hair loss     [x]  Voice deepening Initially concerned about voice deepening, but now wants it    []  Acne     []  Sexual side effects     []  Emotional effects     []  Fertility effects     []  Other (see comments)       Anatomic Inventory  Organs currently available for inventory:   [x]  Breasts   []  Breast tissue: residual after chest reconstruction   [x]  Vagina   [x]  Cervix   [x]  Uterus   [x]  Ovaries   []  Phallus (surgically created)                                           []  Testes (prosthetic)                      Behavioral Health History  Prior psychiatric diagnoses: {npsy 8:23035}  Prior psychiatric hospitalizations: {YES/NO/WILD ZYSAY:30160}  Prior behavioral health treatment: {YES/NO/WILD FUXNA:35573}  Current behavioral health treatment: {YES/NO/WILD UKGUR:42706}    Behavioral Health Screening Tools    PHQ-9: ***     PHQ-9 Score   Depression Severity     Proposed Treatment Actions    (reference)      0  -  4 None - minimal  None     5  -  9  Mild  Watchful waiting; repeat PHQ-9 at follow-up    10 - 14  Moderate  Treatment plan, considering counseling, follow-up and/or pharmacotherapy    15 - 19  Moderately Severe  Active treatment with pharmacotherapy and/or psychotherapy    20 - 27  Severe  Immediate initiation  of pharmacotherapy and, if severe impairment or poor response to therapy, expedited referral to a mental health specialist for psychotherapy and/or collaborative management      GAD-7: ***     GAD-7 Score   Anxiety Severity     0  -  4 None - minimal     5  -  9  Mild    10 - 14  Moderate    15 - 21  Severe                                 Reproductive life planning  Goals: ***  Fertility preservation undergone: ***  Fertility preservation desired: ***  Menstrual pattern: {Desc; regular/irreg:14544}  Current contraceptive method: {Blank single:19197::''surgical'',''condoms: ***'',''estrogen/progesterone OCPs'',''Depo-Provera'',''hormonal IUD'',''nonhormonal IUD'',''currently abstinent'',''not engaged in sexual activity that may result in pregnancy'',''***''}.  Counseled regarding risk of pregnancy on hormones: {yes no:314532}.     Past Medical History  No past medical history on file.    Past Surgical History  Past Surgical History:   Procedure Laterality Date    HIP SURGERY         Family History  No family history on file.     Social History  Social History     Tobacco Use    Smoking status: Never    Smokeless tobacco: Never   Substance Use Topics    Alcohol use: Not Currently    Drug use: Yes     Types: Marijuana     Social History     Social History Narrative    Not on file        Medications  No outpatient medications have been marked as taking for the 12/05/23 encounter (Appointment) with Carloyn Manner, MD.        Allergies  Allergies   Allergen Reactions    Pear Unknown (Patient not available)    Pineapple Other (See Comments)     Blisters on tongue          Physical Exam  There were no vitals taken for this visit.    General appearance: alert, appears stated age and cooperative  Eyes: negative findings: conjunctivae and sclerae normal, pupils equal  Throat: lips, mucosa, and tongue normal; teeth and gums normal  Neck: no adenopathy, supple, symmetrical, trachea midline and thyroid not enlarged, symmetric, no tenderness/mass/nodules  Lungs: clear to auscultation bilaterally  Heart: regular rate and rhythm, S1, S2 normal, no murmur, click, rub or gallop  Abdomen: soft, non-tender; bowel sounds normal; no masses,  no organomegaly  Extremities: extremities normal, atraumatic, no cyanosis or edema  Neuro: Grossly nonfocal  Skin: Normal turgor, no suspicious lesions  Psych: oriented, normal affect, linear thought process, good insight/judgment     Labs  Recent Results (from the past 8736 hour(s))   CBC without differential    Collection Time: 02/05/23  1:05 AM   Result Value Ref Range    White Blood Cell Count 11.13 (H) 4.16 - 9.95 x10E3/uL    Red Blood Cell Count 4.58 3.96 - 5.09 x10E6/uL    Hemoglobin 12.9 11.6 - 15.2 g/dL    Hematocrit 45.4 09.8 - 45.2 %    Mean Corpuscular Volume 86.7 79.3 - 98.6 fL    Mean Corpuscular Hemoglobin 28.2 26.4 - 33.4 pg    MCH Concentration 32.5 31.5 - 35.5 g/dL    Red Cell Distribution Width-SD 44.4 36.9 - 48.3 fL    Red Cell Distribution  Width-CV 13.9 11.1 - 15.5 %    Platelet Count, Auto 256 143 - 398 x10E3/uL    Mean Platelet Volume 9.9 9.3 - 13.0 fL    Nucleated RBC%, automated 0.0 No Ref. Range %    Absolute Nucleated RBC Count 0.00 0.00 - 0.00 x10E3/uL   Electrolyte Panel    Collection Time: 02/05/23  1:05 AM Result Value Ref Range    Sodium 137 135 - 146 mmol/L    Potassium 4.3 3.6 - 5.3 mmol/L    Chloride 103 96 - 106 mmol/L    Total CO2 24 20 - 30 mmol/L    Anion Gap 10 8 - 19 mmol/L   hCG, beta quantitative    Collection Time: 02/05/23  1:05 AM   Result Value Ref Range    hCG,Total beta (ED) <1 See comment mIU/mL   Glucose    Collection Time: 02/05/23  1:05 AM   Result Value Ref Range    Glucose 91 65 - 99 mg/dL   eGFR by Creatinine    Collection Time: 02/05/23  1:05 AM   Result Value Ref Range    Creatinine 0.91 0.60 - 1.30 mg/dL    Estimated GFR >16 See GFR Additional Information mL/min/1.25m2    GFR Additional Information See Comment    Hepatic Funct Panel    Collection Time: 02/05/23  1:05 AM   Result Value Ref Range    Total Protein 7.8 6.1 - 8.2 g/dL    Albumin 3.9 3.9 - 5.0 g/dL    Bilirubin,Total 0.2 0.1 - 1.2 mg/dL    Bilirubin,Conjugated <0.2 <=0.3 mg/dL    Alkaline Phosphatase 90 37 - 113 U/L    Aspartate Aminotransferase 29 13 - 62 U/L    Alanine Aminotransferase 18 8 - 70 U/L   Lipase    Collection Time: 02/05/23  1:05 AM   Result Value Ref Range    Lipase 21 13 - 69 U/L   RH type only    Collection Time: 02/05/23  1:11 AM   Result Value Ref Range    Rh (D) Type Positive    Group A Streptococcus PCR    Collection Time: 06/11/23 10:02 AM    Specimen: Throat; Respiratory, Upper   Result Value Ref Range    Group A Streptococcus PCR Not Detected Not Detected   HIV-1/2 Ag/Ab 4th Generation with Reflex Confirmation    Collection Time: 06/11/23 10:02 AM   Result Value Ref Range    HIV-1/2 Ag/Ab Screen 4th Generation Nonreactive Nonreactive   RPR    Collection Time: 06/11/23 10:02 AM   Result Value Ref Range    RPR Nonreactive Nonreactive   Expedited COVID-19 and Influenza A B PCR, Respiratory Upper    Collection Time: 06/11/23 10:02 AM    Specimen: Nasopharyngeal; Respiratory, Upper   Result Value Ref Range    Specimen Type Respiratory, Upper     COVID-19 PCR/TMA Not Detected Not Detected    Influenza A PCR Not Detected Not Detected    Influenza B PCR Not Detected Not Detected   Infectious Mono Ab (monospot/heterophile test)    Collection Time: 06/11/23 10:03 AM   Result Value Ref Range    Infectious Mono Ab Nonreactive Nonreactive   ED INFORMATION EXCHANGE Ordered by an unspecified provider    Collection Time: 06/12/23  9:21 PM   Result Value Ref Range    Emer. Dept. Info Exchange - Care Plan      Emer. Dept. Advertising copywriter  Emer. Dept. Info Exchange - 30 day Visit Count 2     Emer. Dept. Info Exchange - 180 day Visit Count 3    Group A Streptococcus PCR    Collection Time: 06/12/23  9:49 PM    Specimen: Throat; Respiratory, Upper   Result Value Ref Range    Group A Streptococcus PCR Not Detected Not Detected   Extra Anthoney Harada Top    Collection Time: 06/12/23 10:07 PM   Result Value Ref Range    Extra Tube Performed    Extra Burna Mortimer Top    Collection Time: 06/12/23 10:07 PM   Result Value Ref Range    Extra Tube Performed    Extra Burna Mortimer Top    Collection Time: 06/12/23 10:07 PM   Result Value Ref Range    Extra Tube Performed    Extra Lavender Top    Collection Time: 06/12/23 10:07 PM   Result Value Ref Range    Extra Tube Performed    Extra Lavender Top    Collection Time: 06/12/23 10:07 PM   Result Value Ref Range    Extra Tube Performed    Extra Gold Top    Collection Time: 06/12/23 10:07 PM   Result Value Ref Range    Extra Tube Performed    Bacterial Culture Throat    Collection Time: 06/12/23 10:09 PM    Specimen: Throat; Respiratory, Upper   Result Value Ref Range    Bacterial Culture Throat       No pathogenic Beta Streptococci isolated. Normal respiratory tract flora.   Lipid Panel    Collection Time: 09/13/23  1:26 PM   Result Value Ref Range    Cholesterol 156 See Comment mg/dL    Cholesterol,LDL,Calc 104 (H) <100 mg/dL    Cholesterol, HDL 42 (L) >50 mg/dL    Triglycerides 52 <161 mg/dL    Non-HDL,Chol,Calc 096 <130 mg/dL   Comprehensive Metabolic Panel    Collection Time: 09/13/23  1:26 PM   Result Value Ref Range    Sodium 139 135 - 146 mmol/L    Potassium 3.8 3.6 - 5.3 mmol/L    Chloride 103 96 - 106 mmol/L    Total CO2 23 20 - 30 mmol/L    Anion Gap 13 8 - 19 mmol/L    Glucose 102 (H) 65 - 99 mg/dL    Creatinine 0.45 4.09 - 1.30 mg/dL    Estimated GFR >81 See GFR Additional Information mL/min/1.13m2    GFR Additional Information See Comment     Urea Nitrogen 14 7 - 22 mg/dL    Calcium 9.3 8.6 - 19.1 mg/dL    Total Protein 7.7 6.1 - 8.2 g/dL    Albumin 4.1 3.9 - 5.0 g/dL    Bilirubin,Total 0.4 0.1 - 1.2 mg/dL    Alkaline Phosphatase 70 37 - 113 U/L    Aspartate Aminotransferase 25 13 - 62 U/L    Alanine Aminotransferase 16 8 - 70 U/L   Testosterone    Collection Time: 09/13/23  1:26 PM   Result Value Ref Range    Testosterone 28 10 - 83 ng/dL   Hepatitis B Surface Antigen    Collection Time: 09/13/23  1:26 PM   Result Value Ref Range    HBs Ag Nonreactive Nonreactive   HCV Antibody Screen    Collection Time: 09/13/23  1:26 PM   Result Value Ref Range    HCV Ab Screen Nonreactive Nonreactive   CBC  Collection Time: 09/13/23  1:26 PM   Result Value Ref Range    White Blood Cell Count 11.29 (H) 4.16 - 9.95 x10E3/uL    Red Blood Cell Count 4.12 3.96 - 5.09 x10E6/uL    Hemoglobin 11.8 11.6 - 15.2 g/dL    Hematocrit 13.0 86.5 - 45.2 %    Mean Corpuscular Volume 86.2 79.3 - 98.6 fL    Mean Corpuscular Hemoglobin 28.6 26.4 - 33.4 pg    MCH Concentration 33.2 31.5 - 35.5 g/dL    Red Cell Distribution Width-SD 47.6 36.9 - 48.3 fL    Red Cell Distribution Width-CV 14.9 11.1 - 15.5 %    Platelet Count, Auto 298 143 - 398 x10E3/uL    Mean Platelet Volume 10.1 9.3 - 13.0 fL    Nucleated RBC%, automated 0.0 No Ref. Range %    Absolute Nucleated RBC Count 0.00 0.00 - 0.00 x10E3/uL    Neutrophil Abs (Prelim) 7.26 See Absolute Neut Ct. x10E3/uL   Differential, Automated    Collection Time: 09/13/23  1:26 PM   Result Value Ref Range    Neutrophil Percent, Auto 64.4 No Ref. Range %    Lymphocyte Percent, Auto 30.2 No Ref. Range %    Monocyte Percent, Auto 3.5 No Ref. Range %    Eosinophil Percent, Auto 1.2 No Ref. Range %    Basophil Percent, Auto 0.3 No Ref. Range %    Immature Granulocytes% 0.4 No Reference Range %    Absolute Neut Count 7.26 (H) 1.80 - 6.90 x10E3/uL    Absolute Lymphocyte Count 3.41 (H) 1.30 - 3.40 x10E3/uL    Absolute Mono Count 0.40 0.20 - 0.80 x10E3/uL    Absolute Eos Count 0.14 0.00 - 0.50 x10E3/uL    Absolute Baso Count 0.03 0.00 - 0.10 x10E3/uL    Absolute Immature Gran Count 0.05 (H) 0.00 - 0.04 x10E3/uL       Studies  ***    ASSESSMENT/PLAN  Debbie Knapp is a 23 y.o. patient, seen in the [facility] for the following concerns:    ***         Please note there may be discrepancies in normal laboratory values, medication advisories, preventive health advisories and billing issues for this patient.     The above plan of care, diagnosis, orders, and follow-up were discussed with the patient.  Questions related to this recommended plan of care were answered.    Carloyn Manner, MD  12/05/2023 at 5:16 PM

## 2023-12-06 MED ORDER — BD DISP NEEDLES 25G X 5/8'' MISC
0 refills | 50.00 days | Status: AC
Start: 2023-12-06 — End: ?

## 2023-12-06 MED ORDER — SYRINGE (DISPOSABLE) 1 ML MISC
0 refills | 25.00 days | Status: AC
Start: 2023-12-06 — End: ?

## 2023-12-06 MED ORDER — TESTOSTERONE CYPIONATE 200 MG/ML IM SOLN
50 mg | SUBCUTANEOUS | 0 refills | 28.00 days | Status: AC
Start: 2023-12-06 — End: ?
  Filled 2023-12-12: qty 4, 28d supply, fill #0

## 2023-12-06 MED ORDER — NEEDLE (DISP) 18G X 1'' MISC
0 refills | 50.00 days | Status: AC
Start: 2023-12-06 — End: ?

## 2023-12-07 MED FILL — BD SYRINGE LUER-LOK 1 ML MISC: 1 mL | 25 days supply | Qty: 25 | Fill #0

## 2023-12-07 MED FILL — BD HYPODERMIC NEEDLE 18G X 1'' MISC: 50 days supply | Qty: 50 | Fill #0

## 2023-12-07 MED FILL — BD DISP NEEDLES 25G X 5/8'' MISC: 50 days supply | Qty: 50 | Fill #0

## 2023-12-07 MED FILL — TESTOSTERONE CYPIONATE 200 MG/ML IM SOLN: 200 mg/mL | SUBCUTANEOUS | 28 days supply | Qty: 4 | Fill #0

## 2023-12-09 MED ORDER — WEGOVY 0.25 MG/0.5ML SC SOAJ
.25 mg | SUBCUTANEOUS | 0 refills
Start: 2023-12-09 — End: ?

## 2023-12-10 MED ORDER — WEGOVY 0.25 MG/0.5ML SC SOAJ
.25 mg | SUBCUTANEOUS | 0 refills | 28.00 days | Status: AC
Start: 2023-12-10 — End: ?
  Filled 2023-12-16: qty 2, 28d supply, fill #0

## 2023-12-11 MED FILL — WEGOVY 0.25 MG/0.5ML SC SOAJ: 0.25 MG/0.5ML | SUBCUTANEOUS | 28 days supply | Qty: 2 | Fill #0

## 2023-12-12 MED FILL — BD SYRINGE LUER-LOK 1 ML MISC: 1 mL | 25 days supply | Qty: 25 | Fill #0

## 2023-12-12 MED FILL — BD DISP NEEDLES 25G X 5/8'' MISC: 50 days supply | Qty: 50 | Fill #0

## 2023-12-31 ENCOUNTER — Other Ambulatory Visit: Payer: PRIVATE HEALTH INSURANCE

## 2024-01-08 MED ORDER — WEGOVY 0.25 MG/0.5ML SC SOAJ
.25 mg | SUBCUTANEOUS | 0 refills | 28.00 days | Status: AC
Start: 2024-01-08 — End: ?
  Filled 2024-01-15: qty 2, 28d supply, fill #0

## 2024-01-10 ENCOUNTER — Other Ambulatory Visit: Payer: PRIVATE HEALTH INSURANCE

## 2024-01-12 ENCOUNTER — Ambulatory Visit: Payer: PRIVATE HEALTH INSURANCE

## 2024-01-12 ENCOUNTER — Other Ambulatory Visit: Payer: PRIVATE HEALTH INSURANCE

## 2024-01-12 DIAGNOSIS — M546 Pain in thoracic spine: Secondary | ICD-10-CM
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
## 2024-01-17 ENCOUNTER — Ambulatory Visit: Payer: PRIVATE HEALTH INSURANCE

## 2024-01-17 DIAGNOSIS — F649 Gender identity disorder, unspecified: Secondary | ICD-10-CM

## 2024-02-26 ENCOUNTER — Other Ambulatory Visit: Payer: PRIVATE HEALTH INSURANCE

## 2024-02-26 ENCOUNTER — Ambulatory Visit: Payer: PRIVATE HEALTH INSURANCE

## 2024-02-26 MED ORDER — TESTOSTERONE CYPIONATE 200 MG/ML IM SOLN
50 mg | SUBCUTANEOUS | 0 refills | 28.00 days | Status: AC
Start: 2024-02-26 — End: ?
  Filled 2024-03-12: qty 4, 28d supply, fill #0

## 2024-02-26 NOTE — Telephone Encounter
 Called and spoke to patient     Scheduled with dr.linder on June 6th at 1pm

## 2024-02-27 ENCOUNTER — Ambulatory Visit: Payer: PRIVATE HEALTH INSURANCE

## 2024-02-27 MED ORDER — SEMAGLUTIDE-WEIGHT MANAGEMENT 0.5 MG/0.5ML SC SOAJ
.5 mg | SUBCUTANEOUS | 0 refills | 28.00 days | Status: AC
Start: 2024-02-27 — End: ?
  Filled 2024-03-13: qty 2, 28d supply, fill #0

## 2024-02-27 MED FILL — TESTOSTERONE CYPIONATE 200 MG/ML IM SOLN: 200 mg/mL | SUBCUTANEOUS | 28 days supply | Qty: 4 | Fill #0

## 2024-02-28 MED FILL — WEGOVY 0.5 MG/0.5ML SC SOAJ: 0.5 mg/ mL | SUBCUTANEOUS | 28 days supply | Qty: 2 | Fill #0

## 2024-03-13 MED FILL — TESTOSTERONE CYPIONATE 200 MG/ML IM SOLN: 200 mg/mL | SUBCUTANEOUS | 28 days supply | Qty: 4 | Fill #0

## 2024-03-22 ENCOUNTER — Ambulatory Visit: Payer: PRIVATE HEALTH INSURANCE

## 2024-03-22 ENCOUNTER — Other Ambulatory Visit: Payer: PRIVATE HEALTH INSURANCE

## 2024-03-22 DIAGNOSIS — L729 Follicular cyst of the skin and subcutaneous tissue, unspecified: Secondary | ICD-10-CM

## 2024-03-22 NOTE — Progress Notes
 PROGRESS NOTE    Patient:  Debbie Knapp    Medical record number:  [id number]    Date of birth:  2001/07/28  Date of service:  03/22/2024      History of present illness:           Debbie Knapp is a 23 y.o. female          Who presents with the following history of present illness:    When got out of shower, something on abd looked red then popped looked  red, had bleeding, now has hole in spot    Past medical history:         There is no problem list on file for this patient.         No past medical history on file.  Past surgical history:         Past Surgical History:   Procedure Laterality Date    HIP SURGERY         Medications:         No outpatient medications have been marked as taking for the 03/22/24 encounter (Appointment) with Bahja Bence, Ave Leisure, DO.       Allergy:         Allergies   Allergen Reactions    Pear Unknown (Patient not available)    Pineapple Other (See Comments)     Blisters on tongue       Family history:       No family history on file.    Social history:         Social History     Tobacco Use    Smoking status: Never    Smokeless tobacco: Never   Substance Use Topics    Alcohol use: Not Currently                 PE:      Appearance:   Well nourished, well developed, in no acute distress  Psychiatric:   Mood normal, affect appropriate    Labs  Results for orders placed or performed in visit on 09/13/23   Comprehensive Metabolic Panel   Result Value Ref Range    Sodium 139 135 - 146 mmol/L    Potassium 3.8 3.6 - 5.3 mmol/L    Chloride 103 96 - 106 mmol/L    Total CO2 23 20 - 30 mmol/L    Anion Gap 13 8 - 19 mmol/L    Glucose 102 (H) 65 - 99 mg/dL    Creatinine 4.54 0.98 - 1.30 mg/dL    Estimated GFR >11 See GFR Additional Information mL/min/1.30m2    GFR Additional Information See Comment     Urea Nitrogen 14 7 - 22 mg/dL    Calcium 9.3 8.6 - 91.4 mg/dL    Total Protein 7.7 6.1 - 8.2 g/dL    Albumin 4.1 3.9 - 5.0 g/dL    Bilirubin,Total 0.4 0.1 - 1.2 mg/dL    Alkaline Phosphatase 70 37 - 113 U/L    Aspartate Aminotransferase 25 13 - 62 U/L    Alanine Aminotransferase 16 8 - 70 U/L       Assessment/ Plan:    Diagnoses and all orders for this visit:    Cyst of skin    Sp rupture  Keep clean and dry   Antibiotics ointment  Follow up as needed       There are no Patient Instructions on file for this visit.     The above  recommendation were discussed with the patient.  The patient has all questions answered satisfactorily and is in agreement with this recommended plan of care.    Author:       Pepper Boyer, MD  1:09 PM    Patient Consent to Telehealth Questionnaire        No data to display              - I agree  to be treated via a video visit and acknowledge that I may be liable for any relevant copays or coinsurance depending on my insurance plan.  - I understand that this video visit is offered for my convenience and I am able to cancel and reschedule for an in-person appointment if I desire.  - I also acknowledge that sensitive medical information may be discussed during this video visit appointment and that it is my responsibility to locate myself in a location that ensures privacy to my own level of comfort.  - I also acknowledge that I should not be participating in a video visit in a way that could cause danger to myself or to those around me (such as driving or walking).  If my provider is concerned about my safety, I understand that they have the right to terminate the visit.

## 2024-03-25 ENCOUNTER — Telehealth: Payer: PRIVATE HEALTH INSURANCE

## 2024-03-25 NOTE — Telephone Encounter
 Called patient and lvm advising dr.linder has left [facility]     Dr.anderson Gaylyn Keas can see the patient same day and time June 6th at 1pm.     Awaiting c.b to confirm if interested or to cancel appt

## 2024-03-27 ENCOUNTER — Other Ambulatory Visit: Payer: PRIVATE HEALTH INSURANCE

## 2024-04-09 MED ORDER — WEGOVY 0.5 MG/0.5ML SC SOAJ
.5 mg | SUBCUTANEOUS | 0 refills | 28.00000 days | Status: AC
Start: 2024-04-09 — End: ?
  Filled 2024-04-24: qty 2, 28d supply, fill #0

## 2024-04-10 MED FILL — WEGOVY 0.5 MG/0.5ML SC SOAJ: 0.5 MG/ML | SUBCUTANEOUS | 28 days supply | Qty: 2 | Fill #0

## 2024-04-22 ENCOUNTER — Telehealth: Payer: PRIVATE HEALTH INSURANCE

## 2024-04-22 NOTE — Telephone Encounter
 I left a voicemail regarding the medical insurance and to confirm the appointment. Anderson appt set for 04/24/2024.

## 2024-04-23 ENCOUNTER — Other Ambulatory Visit: Payer: PRIVATE HEALTH INSURANCE

## 2024-04-23 ENCOUNTER — Telehealth: Payer: PRIVATE HEALTH INSURANCE

## 2024-04-23 NOTE — Telephone Encounter
 I left a voicemail for the patient to reach out to me regardingt he medical insurance and appt set for 04/24/2024.

## 2024-04-23 NOTE — Telephone Encounter
 Called and left message for pt informing them insurance has been suspended due to missing renewal forms. Advised to follow up with insurance, otherwise 04/24/2024 appt will be cancelled    MyChart message sent

## 2024-04-24 ENCOUNTER — Telehealth: Payer: PRIVATE HEALTH INSURANCE

## 2024-04-24 ENCOUNTER — Other Ambulatory Visit: Payer: PRIVATE HEALTH INSURANCE

## 2024-04-24 ENCOUNTER — Ambulatory Visit: Payer: PRIVATE HEALTH INSURANCE

## 2024-04-24 NOTE — Telephone Encounter
 Called and left message for patient informing them the insurance still shows suspended and today's appointment has been cancelled, also advised to call insurance to make any necessary changes or updates.    MyChart message sent

## 2024-05-13 ENCOUNTER — Other Ambulatory Visit: Payer: PRIVATE HEALTH INSURANCE

## 2024-07-08 ENCOUNTER — Ambulatory Visit: Payer: PRIVATE HEALTH INSURANCE | Attending: Student in an Organized Health Care Education/Training Program

## 2024-08-14 ENCOUNTER — Ambulatory Visit: Payer: PRIVATE HEALTH INSURANCE | Attending: Student in an Organized Health Care Education/Training Program

## 2024-09-04 ENCOUNTER — Telehealth: Payer: PRIVATE HEALTH INSURANCE

## 2024-09-04 NOTE — Telephone Encounter
 Call Back Request      Reason for call back: Patient is trying to schedule an appointment for Top Surgery. The referral is incomplete, I advised I couldn't offer an appointment at the moment. I tried providing a fax# email and mychart link for patient to upload needed documents. Patient grew frustrated. Please advise.    Any Symptoms:  []  Yes  [x]  No      If yes, what symptoms are you experiencing:    Duration of symptoms (how long):    Have you taken medication for symptoms (OTC or Rx):      If call was taken outside of clinic hours:    [] Patient or caller has been notified that this message was sent outside of normal clinic hours.     [] Patient or caller has been warm transferred to the physician's answering service. If applicable, patient or caller informed to please call us  back if symptoms progress.  Patient or caller has been notified of the turnaround time of 1-2 business day(s).
